# Patient Record
Sex: Male | Born: 1990 | Race: White | Hispanic: No | Marital: Married | Smoking: Never smoker
Health system: Southern US, Community
[De-identification: ages and names within clinical notes are randomized; demographics above are authoritative.]

## PROBLEM LIST (undated history)

## (undated) DIAGNOSIS — L709 Acne, unspecified: Secondary | ICD-10-CM

## (undated) HISTORY — DX: Acne, unspecified: L70.9

---
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
## 2013-12-11 ENCOUNTER — Telehealth: Payer: Self-pay

## 2013-12-11 NOTE — Telephone Encounter (Signed)
Pt left v/m requesting date of last tetanus inj; left v/m requesting cb.

## 2014-03-12 NOTE — Telephone Encounter (Signed)
Spoke with pts mother and she has already gotten info; nothing further needed.

## 2019-07-15 ENCOUNTER — Telehealth: Payer: Self-pay | Admitting: General Practice

## 2019-07-15 NOTE — Telephone Encounter (Signed)
Removed Dr. Aron as patient's PCP due to her leaving this practice and patient has not been seen by her in 8 years.
